# Patient Record
Sex: Male | Born: 1940 | Smoking: Former smoker
Health system: Southern US, Community
[De-identification: ages and names within clinical notes are randomized; demographics above are authoritative.]

## PROBLEM LIST (undated history)

## (undated) DIAGNOSIS — I471 Supraventricular tachycardia: Secondary | ICD-10-CM

## (undated) DIAGNOSIS — N529 Male erectile dysfunction, unspecified: Secondary | ICD-10-CM

## (undated) DIAGNOSIS — Z86010 Personal history of colonic polyps: Principal | ICD-10-CM

## (undated) DIAGNOSIS — K648 Other hemorrhoids: Secondary | ICD-10-CM

## (undated) DIAGNOSIS — K644 Residual hemorrhoidal skin tags: Secondary | ICD-10-CM

## (undated) DIAGNOSIS — J309 Allergic rhinitis, unspecified: Secondary | ICD-10-CM

## (undated) DIAGNOSIS — F419 Anxiety disorder, unspecified: Secondary | ICD-10-CM

## (undated) DIAGNOSIS — E785 Hyperlipidemia, unspecified: Secondary | ICD-10-CM

## (undated) DIAGNOSIS — K5520 Angiodysplasia of colon without hemorrhage: Secondary | ICD-10-CM

## (undated) HISTORY — PX: OTHER SURGICAL HISTORY: SHX169

## (undated) HISTORY — DX: Residual hemorrhoidal skin tags: K64.4

## (undated) HISTORY — DX: Angiodysplasia of colon without hemorrhage: K55.20

## (undated) HISTORY — DX: Other hemorrhoids: K64.8

## (undated) HISTORY — DX: Supraventricular tachycardia: I47.1

## (undated) HISTORY — PX: COLONOSCOPY: SHX174

## (undated) HISTORY — DX: Allergic rhinitis, unspecified: J30.9

## (undated) HISTORY — DX: Hyperlipidemia, unspecified: E78.5

## (undated) HISTORY — DX: Male erectile dysfunction, unspecified: N52.9

## (undated) HISTORY — DX: Personal history of colonic polyps: Z86.010

## (undated) HISTORY — DX: Anxiety disorder, unspecified: F41.9

## (undated) HISTORY — PX: CATARACT EXTRACTION: SUR2

---

## 2007-03-31 ENCOUNTER — Ambulatory Visit: Payer: Self-pay | Admitting: Internal Medicine

## 2007-04-14 ENCOUNTER — Ambulatory Visit: Payer: Self-pay | Admitting: Internal Medicine

## 2014-06-14 DIAGNOSIS — H33313 Horseshoe tear of retina without detachment, bilateral: Secondary | ICD-10-CM | POA: Diagnosis not present

## 2014-06-14 DIAGNOSIS — H35372 Puckering of macula, left eye: Secondary | ICD-10-CM | POA: Diagnosis not present

## 2014-06-14 DIAGNOSIS — Z87891 Personal history of nicotine dependence: Secondary | ICD-10-CM | POA: Diagnosis not present

## 2014-06-14 DIAGNOSIS — I10 Essential (primary) hypertension: Secondary | ICD-10-CM | POA: Diagnosis not present

## 2014-06-14 DIAGNOSIS — N400 Enlarged prostate without lower urinary tract symptoms: Secondary | ICD-10-CM | POA: Diagnosis not present

## 2014-06-14 DIAGNOSIS — F419 Anxiety disorder, unspecified: Secondary | ICD-10-CM | POA: Diagnosis not present

## 2014-06-14 DIAGNOSIS — I2510 Atherosclerotic heart disease of native coronary artery without angina pectoris: Secondary | ICD-10-CM | POA: Diagnosis not present

## 2014-06-14 DIAGNOSIS — Z7982 Long term (current) use of aspirin: Secondary | ICD-10-CM | POA: Diagnosis not present

## 2014-06-14 DIAGNOSIS — Z9889 Other specified postprocedural states: Secondary | ICD-10-CM | POA: Diagnosis not present

## 2014-06-15 DIAGNOSIS — H43813 Vitreous degeneration, bilateral: Secondary | ICD-10-CM | POA: Diagnosis not present

## 2014-06-15 DIAGNOSIS — Z9889 Other specified postprocedural states: Secondary | ICD-10-CM | POA: Diagnosis not present

## 2014-06-15 DIAGNOSIS — Z48810 Encounter for surgical aftercare following surgery on the sense organs: Secondary | ICD-10-CM | POA: Diagnosis not present

## 2014-06-15 DIAGNOSIS — H2513 Age-related nuclear cataract, bilateral: Secondary | ICD-10-CM | POA: Diagnosis not present

## 2014-06-15 DIAGNOSIS — H33313 Horseshoe tear of retina without detachment, bilateral: Secondary | ICD-10-CM | POA: Diagnosis not present

## 2014-08-23 DIAGNOSIS — H40013 Open angle with borderline findings, low risk, bilateral: Secondary | ICD-10-CM | POA: Diagnosis not present

## 2014-09-13 DIAGNOSIS — L821 Other seborrheic keratosis: Secondary | ICD-10-CM | POA: Diagnosis not present

## 2014-09-21 DIAGNOSIS — N401 Enlarged prostate with lower urinary tract symptoms: Secondary | ICD-10-CM | POA: Diagnosis not present

## 2014-09-21 DIAGNOSIS — R351 Nocturia: Secondary | ICD-10-CM | POA: Diagnosis not present

## 2014-09-21 DIAGNOSIS — I10 Essential (primary) hypertension: Secondary | ICD-10-CM | POA: Diagnosis not present

## 2014-09-22 DIAGNOSIS — K640 First degree hemorrhoids: Secondary | ICD-10-CM | POA: Diagnosis not present

## 2014-09-22 DIAGNOSIS — N401 Enlarged prostate with lower urinary tract symptoms: Secondary | ICD-10-CM | POA: Diagnosis not present

## 2014-09-22 DIAGNOSIS — R351 Nocturia: Secondary | ICD-10-CM | POA: Diagnosis not present

## 2014-09-22 DIAGNOSIS — I10 Essential (primary) hypertension: Secondary | ICD-10-CM | POA: Diagnosis not present

## 2014-09-22 DIAGNOSIS — F419 Anxiety disorder, unspecified: Secondary | ICD-10-CM | POA: Diagnosis not present

## 2014-11-24 DIAGNOSIS — B351 Tinea unguium: Secondary | ICD-10-CM | POA: Diagnosis not present

## 2014-11-24 DIAGNOSIS — M79675 Pain in left toe(s): Secondary | ICD-10-CM | POA: Diagnosis not present

## 2014-11-24 DIAGNOSIS — M79674 Pain in right toe(s): Secondary | ICD-10-CM | POA: Diagnosis not present

## 2014-12-02 DIAGNOSIS — H2513 Age-related nuclear cataract, bilateral: Secondary | ICD-10-CM | POA: Diagnosis not present

## 2014-12-02 DIAGNOSIS — H43813 Vitreous degeneration, bilateral: Secondary | ICD-10-CM | POA: Diagnosis not present

## 2014-12-02 DIAGNOSIS — H33313 Horseshoe tear of retina without detachment, bilateral: Secondary | ICD-10-CM | POA: Diagnosis not present

## 2014-12-02 DIAGNOSIS — H35373 Puckering of macula, bilateral: Secondary | ICD-10-CM | POA: Diagnosis not present

## 2014-12-22 DIAGNOSIS — M79675 Pain in left toe(s): Secondary | ICD-10-CM | POA: Diagnosis not present

## 2014-12-22 DIAGNOSIS — Z23 Encounter for immunization: Secondary | ICD-10-CM | POA: Diagnosis not present

## 2014-12-22 DIAGNOSIS — M79674 Pain in right toe(s): Secondary | ICD-10-CM | POA: Diagnosis not present

## 2015-02-02 DIAGNOSIS — H2513 Age-related nuclear cataract, bilateral: Secondary | ICD-10-CM | POA: Diagnosis not present

## 2015-03-14 DIAGNOSIS — M79675 Pain in left toe(s): Secondary | ICD-10-CM | POA: Diagnosis not present

## 2015-03-14 DIAGNOSIS — M79674 Pain in right toe(s): Secondary | ICD-10-CM | POA: Diagnosis not present

## 2015-04-04 DIAGNOSIS — L821 Other seborrheic keratosis: Secondary | ICD-10-CM | POA: Diagnosis not present

## 2015-04-04 DIAGNOSIS — L570 Actinic keratosis: Secondary | ICD-10-CM | POA: Diagnosis not present

## 2015-04-04 DIAGNOSIS — L578 Other skin changes due to chronic exposure to nonionizing radiation: Secondary | ICD-10-CM | POA: Diagnosis not present

## 2015-04-07 DIAGNOSIS — R351 Nocturia: Secondary | ICD-10-CM | POA: Diagnosis not present

## 2015-04-07 DIAGNOSIS — I10 Essential (primary) hypertension: Secondary | ICD-10-CM | POA: Diagnosis not present

## 2015-04-07 DIAGNOSIS — N401 Enlarged prostate with lower urinary tract symptoms: Secondary | ICD-10-CM | POA: Diagnosis not present

## 2015-05-26 DIAGNOSIS — I739 Peripheral vascular disease, unspecified: Secondary | ICD-10-CM | POA: Diagnosis not present

## 2015-05-26 DIAGNOSIS — Z0000 Encounter for general adult medical examination without abnormal findings: Secondary | ICD-10-CM | POA: Diagnosis not present

## 2015-05-26 DIAGNOSIS — Z23 Encounter for immunization: Secondary | ICD-10-CM | POA: Diagnosis not present

## 2015-05-26 DIAGNOSIS — N401 Enlarged prostate with lower urinary tract symptoms: Secondary | ICD-10-CM | POA: Diagnosis not present

## 2015-05-26 DIAGNOSIS — N529 Male erectile dysfunction, unspecified: Secondary | ICD-10-CM | POA: Diagnosis not present

## 2015-05-26 DIAGNOSIS — Z125 Encounter for screening for malignant neoplasm of prostate: Secondary | ICD-10-CM | POA: Diagnosis not present

## 2015-05-26 DIAGNOSIS — I10 Essential (primary) hypertension: Secondary | ICD-10-CM | POA: Diagnosis not present

## 2015-05-26 DIAGNOSIS — I472 Ventricular tachycardia: Secondary | ICD-10-CM | POA: Diagnosis not present

## 2015-05-26 DIAGNOSIS — J301 Allergic rhinitis due to pollen: Secondary | ICD-10-CM | POA: Diagnosis not present

## 2015-05-26 DIAGNOSIS — R351 Nocturia: Secondary | ICD-10-CM | POA: Diagnosis not present

## 2015-06-13 DIAGNOSIS — Z9889 Other specified postprocedural states: Secondary | ICD-10-CM | POA: Diagnosis not present

## 2015-06-13 DIAGNOSIS — I10 Essential (primary) hypertension: Secondary | ICD-10-CM | POA: Diagnosis not present

## 2015-06-13 DIAGNOSIS — I2510 Atherosclerotic heart disease of native coronary artery without angina pectoris: Secondary | ICD-10-CM | POA: Diagnosis not present

## 2015-06-13 DIAGNOSIS — Z7982 Long term (current) use of aspirin: Secondary | ICD-10-CM | POA: Diagnosis not present

## 2015-06-13 DIAGNOSIS — H2513 Age-related nuclear cataract, bilateral: Secondary | ICD-10-CM | POA: Diagnosis not present

## 2015-06-13 DIAGNOSIS — Z87891 Personal history of nicotine dependence: Secondary | ICD-10-CM | POA: Diagnosis not present

## 2015-06-13 DIAGNOSIS — H2512 Age-related nuclear cataract, left eye: Secondary | ICD-10-CM | POA: Diagnosis not present

## 2015-06-16 DIAGNOSIS — Z87891 Personal history of nicotine dependence: Secondary | ICD-10-CM | POA: Diagnosis not present

## 2015-06-16 DIAGNOSIS — J310 Chronic rhinitis: Secondary | ICD-10-CM | POA: Diagnosis not present

## 2015-06-16 DIAGNOSIS — Z7982 Long term (current) use of aspirin: Secondary | ICD-10-CM | POA: Diagnosis not present

## 2015-06-16 DIAGNOSIS — H25812 Combined forms of age-related cataract, left eye: Secondary | ICD-10-CM | POA: Diagnosis not present

## 2015-06-16 DIAGNOSIS — I10 Essential (primary) hypertension: Secondary | ICD-10-CM | POA: Diagnosis not present

## 2015-06-16 DIAGNOSIS — I2510 Atherosclerotic heart disease of native coronary artery without angina pectoris: Secondary | ICD-10-CM | POA: Diagnosis not present

## 2015-06-16 DIAGNOSIS — F419 Anxiety disorder, unspecified: Secondary | ICD-10-CM | POA: Diagnosis not present

## 2015-07-14 DIAGNOSIS — F419 Anxiety disorder, unspecified: Secondary | ICD-10-CM | POA: Diagnosis not present

## 2015-07-14 DIAGNOSIS — J310 Chronic rhinitis: Secondary | ICD-10-CM | POA: Diagnosis not present

## 2015-07-14 DIAGNOSIS — H25811 Combined forms of age-related cataract, right eye: Secondary | ICD-10-CM | POA: Diagnosis not present

## 2015-07-14 DIAGNOSIS — Z87891 Personal history of nicotine dependence: Secondary | ICD-10-CM | POA: Diagnosis not present

## 2015-07-14 DIAGNOSIS — I10 Essential (primary) hypertension: Secondary | ICD-10-CM | POA: Diagnosis not present

## 2015-07-25 DIAGNOSIS — I472 Ventricular tachycardia: Secondary | ICD-10-CM | POA: Diagnosis not present

## 2015-08-30 DIAGNOSIS — H578 Other specified disorders of eye and adnexa: Secondary | ICD-10-CM | POA: Diagnosis not present

## 2015-10-03 DIAGNOSIS — D225 Melanocytic nevi of trunk: Secondary | ICD-10-CM | POA: Diagnosis not present

## 2015-10-03 DIAGNOSIS — L821 Other seborrheic keratosis: Secondary | ICD-10-CM | POA: Diagnosis not present

## 2015-10-03 DIAGNOSIS — L570 Actinic keratosis: Secondary | ICD-10-CM | POA: Diagnosis not present

## 2015-11-18 DIAGNOSIS — H26493 Other secondary cataract, bilateral: Secondary | ICD-10-CM | POA: Diagnosis not present

## 2015-12-23 DIAGNOSIS — H26492 Other secondary cataract, left eye: Secondary | ICD-10-CM | POA: Diagnosis not present

## 2016-01-03 DIAGNOSIS — M25531 Pain in right wrist: Secondary | ICD-10-CM | POA: Diagnosis not present

## 2016-02-14 DIAGNOSIS — M19031 Primary osteoarthritis, right wrist: Secondary | ICD-10-CM | POA: Diagnosis not present

## 2016-03-07 DIAGNOSIS — E785 Hyperlipidemia, unspecified: Secondary | ICD-10-CM | POA: Diagnosis not present

## 2016-03-07 DIAGNOSIS — Z125 Encounter for screening for malignant neoplasm of prostate: Secondary | ICD-10-CM | POA: Diagnosis not present

## 2016-03-07 DIAGNOSIS — F411 Generalized anxiety disorder: Secondary | ICD-10-CM | POA: Diagnosis not present

## 2016-03-07 DIAGNOSIS — Z0000 Encounter for general adult medical examination without abnormal findings: Secondary | ICD-10-CM | POA: Diagnosis not present

## 2016-03-07 DIAGNOSIS — N529 Male erectile dysfunction, unspecified: Secondary | ICD-10-CM | POA: Diagnosis not present

## 2016-03-07 DIAGNOSIS — R030 Elevated blood-pressure reading, without diagnosis of hypertension: Secondary | ICD-10-CM | POA: Diagnosis not present

## 2016-03-07 DIAGNOSIS — Z23 Encounter for immunization: Secondary | ICD-10-CM | POA: Diagnosis not present

## 2016-03-07 DIAGNOSIS — H6123 Impacted cerumen, bilateral: Secondary | ICD-10-CM | POA: Diagnosis not present

## 2016-03-07 DIAGNOSIS — Z1211 Encounter for screening for malignant neoplasm of colon: Secondary | ICD-10-CM | POA: Diagnosis not present

## 2016-03-08 ENCOUNTER — Encounter: Payer: Self-pay | Admitting: Internal Medicine

## 2016-04-16 DIAGNOSIS — D225 Melanocytic nevi of trunk: Secondary | ICD-10-CM | POA: Diagnosis not present

## 2016-04-16 DIAGNOSIS — L728 Other follicular cysts of the skin and subcutaneous tissue: Secondary | ICD-10-CM | POA: Diagnosis not present

## 2016-04-16 DIAGNOSIS — L570 Actinic keratosis: Secondary | ICD-10-CM | POA: Diagnosis not present

## 2016-04-16 DIAGNOSIS — L821 Other seborrheic keratosis: Secondary | ICD-10-CM | POA: Diagnosis not present

## 2016-05-01 DIAGNOSIS — Z96651 Presence of right artificial knee joint: Secondary | ICD-10-CM | POA: Diagnosis not present

## 2016-05-01 DIAGNOSIS — Z471 Aftercare following joint replacement surgery: Secondary | ICD-10-CM | POA: Diagnosis not present

## 2016-05-01 DIAGNOSIS — M19031 Primary osteoarthritis, right wrist: Secondary | ICD-10-CM | POA: Diagnosis not present

## 2016-05-01 DIAGNOSIS — M25561 Pain in right knee: Secondary | ICD-10-CM | POA: Diagnosis not present

## 2016-05-03 DIAGNOSIS — Z471 Aftercare following joint replacement surgery: Secondary | ICD-10-CM | POA: Diagnosis not present

## 2016-05-03 DIAGNOSIS — Z96651 Presence of right artificial knee joint: Secondary | ICD-10-CM | POA: Diagnosis not present

## 2016-05-03 DIAGNOSIS — M25561 Pain in right knee: Secondary | ICD-10-CM | POA: Diagnosis not present

## 2016-05-08 DIAGNOSIS — M25561 Pain in right knee: Secondary | ICD-10-CM | POA: Diagnosis not present

## 2016-05-10 ENCOUNTER — Ambulatory Visit: Payer: Self-pay | Admitting: Internal Medicine

## 2016-05-22 DIAGNOSIS — M25561 Pain in right knee: Secondary | ICD-10-CM | POA: Diagnosis not present

## 2016-05-29 DIAGNOSIS — M25561 Pain in right knee: Secondary | ICD-10-CM | POA: Diagnosis not present

## 2016-06-28 ENCOUNTER — Encounter: Payer: Self-pay | Admitting: Internal Medicine

## 2016-06-28 ENCOUNTER — Encounter (INDEPENDENT_AMBULATORY_CARE_PROVIDER_SITE_OTHER): Payer: Self-pay

## 2016-06-28 ENCOUNTER — Ambulatory Visit (INDEPENDENT_AMBULATORY_CARE_PROVIDER_SITE_OTHER): Payer: Medicare Other | Admitting: Internal Medicine

## 2016-06-28 VITALS — BP 110/70 | HR 60 | Ht 70.67 in | Wt 172.2 lb

## 2016-06-28 DIAGNOSIS — K625 Hemorrhage of anus and rectum: Secondary | ICD-10-CM | POA: Diagnosis not present

## 2016-06-28 NOTE — Progress Notes (Signed)
   Michael Kelley 76 y.o. [birth year] [id number]  Assessment & Plan:   Encounter Diagnosis  Name Primary?  . Rectal bleeding Yes   Last colonoscopy 2008 -ext hemorrhoids Will repeat - sounds hemorrhoidal but almost 10 yrs since 11/08 colonoscopy and in his age group higher risk neoplasia The risks and benefits as well as alternatives of endoscopic procedure(s) have been discussed and reviewed. All questions answered. The patient agrees to proceed.  Depending upon results consider banding of hemorrhoids. Discussed some today.  Cc: Dr. Simonne Maffucci  Subjective:   Chief Complaint: rectal bleeding  HPI  76 yo widowed wm with 2 year hx intermittent rectal bleeding - bright red on toilet paper and worse with hard stools (occasional). In general defecates w/o problems. No prolapse sxs. Exercises a fair amount - sports etc but in general does not lift weights. Has not tried any treatments. No Known Allergies Current Meds  Medication Sig  . ALPRAZolam (XANAX) 0.25 MG tablet 0.25 mg as needed.  Marland Kitchen ascorbic acid (VITAMIN C) 250 MG tablet 250 mg daily.  Marland Kitchen aspirin 81 MG chewable tablet 182 mg 2 (two) times daily.  . fluticasone (FLONASE) 50 MCG/ACT nasal spray as needed.  Marland Kitchen glucosamine-chondroitin 500-400 MG tablet 2 (two) times daily.  Marland Kitchen ibuprofen (ADVIL) 200 MG tablet Take by mouth as needed.  . Multiple Vitamin (MULTI-VITAMINS) TABS daily.  Clarnce Flock Palmetto Extract 80-15 MG CAPS Take by mouth daily.  . tadalafil (CIALIS) 20 MG tablet as directed.    Past Surgical History:  Procedure Laterality Date  . CATARACT EXTRACTION    . COLONOSCOPY    . heart ablation     Social History   Social History  . Marital status: Widowed    Spouse name: N/A  . Number of children: 2  . Years of education: N/A   Occupational History  . retired    Social History Main Topics  . Smoking status: Former Research scientist (life sciences)  . Smokeless tobacco: Never Used  . Alcohol use 1.2 - 1.8 oz/week    2 - 3 Cans of  beer per week     Comment: daily  . Drug use: No  . Sexual activity: Not Asked   Other Topics Concern  . None   Social History Narrative   Retired Diplomatic Services operational officer   Widowed   1 son and 1 daughter   2-3 caffeine/day   06/28/2016      Family History  Problem Relation Age of Onset  . Breast cancer Mother   . Lung cancer Father   . Heart attack Brother 65    died  . Colon cancer Neg Hx   . Stomach cancer Neg Hx   . Rectal cancer Neg Hx   . Esophageal cancer Neg Hx   . Liver cancer Neg Hx     Review of Systems All other ROS negative  Objective:   Physical Exam BP 110/70   Pulse 60   Ht 5' 10.67" (1.795 m)   Wt 172 lb 4 oz (78.1 kg)   BMI 24.25 kg/m  Anicteric Lungs cta cor s1s2 no murmur abd soft NT Rectal is deferred Alert and oriented x 3 and appropriate mood/affect

## 2016-06-28 NOTE — Patient Instructions (Signed)
You have been scheduled for a colonoscopy. Please follow written instructions given to you at your visit today.  Please pick up your prep supplies at the pharmacy. If you use inhalers (even only as needed), please bring them with you on the day of your procedure.   I appreciate the opportunity to care for you. Carl Gessner, MD, FACG 

## 2016-07-03 DIAGNOSIS — M19031 Primary osteoarthritis, right wrist: Secondary | ICD-10-CM | POA: Diagnosis not present

## 2016-08-02 ENCOUNTER — Encounter: Payer: Self-pay | Admitting: Internal Medicine

## 2016-08-02 ENCOUNTER — Ambulatory Visit (AMBULATORY_SURGERY_CENTER): Payer: Medicare Other | Admitting: Internal Medicine

## 2016-08-02 VITALS — BP 122/69 | HR 53 | Temp 98.0°F | Resp 14 | Ht 70.0 in | Wt 172.0 lb

## 2016-08-02 DIAGNOSIS — K625 Hemorrhage of anus and rectum: Secondary | ICD-10-CM

## 2016-08-02 DIAGNOSIS — D124 Benign neoplasm of descending colon: Secondary | ICD-10-CM

## 2016-08-02 DIAGNOSIS — Z1211 Encounter for screening for malignant neoplasm of colon: Secondary | ICD-10-CM | POA: Diagnosis not present

## 2016-08-02 DIAGNOSIS — K5520 Angiodysplasia of colon without hemorrhage: Secondary | ICD-10-CM

## 2016-08-02 HISTORY — DX: Angiodysplasia of colon without hemorrhage: K55.20

## 2016-08-02 MED ORDER — SODIUM CHLORIDE 0.9 % IV SOLN: 500.0000 mL | INTRAVENOUS | Status: AC

## 2016-08-02 NOTE — Patient Instructions (Addendum)
I found and removed one tiny polyp - looks benign. You have hemorrhoids and thickened skin tags in the anal area - cause of bleeding.  There was also a tiny lesion called an AVM - sometimes leaks blood or bleeds but yours is small and appears inconsequential.  High fiber diet is recommended.  I do not think you need another routine colonoscopy.  I appreciate the opportunity to care for you. Gatha Mayer, MD, [facility]   Discharge instructions given. Handouts on polyps and hemorrhoids. Resume previous medications. YOU HAD AN ENDOSCOPIC PROCEDURE TODAY AT [facility]:   Refer to the procedure report that was given to you for any specific questions about what was found during the examination.  If the procedure report does not answer your questions, please call your gastroenterologist to clarify.  If you requested that your care partner not be given the details of your procedure findings, then the procedure report has been included in a sealed envelope for you to review at your convenience later.  YOU SHOULD EXPECT: Some feelings of bloating in the abdomen. Passage of more gas than usual.  Walking can help get rid of the air that was put into your GI tract during the procedure and reduce the bloating. If you had a lower endoscopy (such as a colonoscopy or flexible sigmoidoscopy) you may notice spotting of blood in your stool or on the toilet paper. If you underwent a bowel prep for your procedure, you may not have a normal bowel movement for a few days.  Please Note:  You might notice some irritation and congestion in your nose or some drainage.  This is from the oxygen used during your procedure.  There is no need for concern and it should clear up in a day or so.  SYMPTOMS TO REPORT IMMEDIATELY:   Following lower endoscopy (colonoscopy or flexible sigmoidoscopy):  Excessive amounts of blood in the stool  Significant tenderness or worsening of abdominal  pains  Swelling of the abdomen that is new, acute  Fever of 100F or higher   For urgent or emergent issues, a gastroenterologist can be reached at any hour by calling [phone].   DIET:  We do recommend a small meal at first, but then you may proceed to your regular diet.  Drink plenty of fluids but you should avoid alcoholic beverages for 24 hours.  ACTIVITY:  You should plan to take it easy for the rest of today and you should NOT DRIVE or use heavy machinery until tomorrow (because of the sedation medicines used during the test).    FOLLOW UP: Our staff will call the number listed on your records the next business day following your procedure to check on you and address any questions or concerns that you may have regarding the information given to you following your procedure. If we do not reach you, we will leave a message.  However, if you are feeling well and you are not experiencing any problems, there is no need to return our call.  We will assume that you have returned to your regular daily activities without incident.  If any biopsies were taken you will be contacted by phone or by letter within the next 1-3 weeks.  Please call us at [phone] if you have not heard about the biopsies in 3 weeks.    SIGNATURES/CONFIDENTIALITY: You and/or your care partner have signed paperwork which will be entered into your electronic medical record.  These signatures  attest to the fact that that the information above on your After Visit Summary has been reviewed and is understood.  Full responsibility of the confidentiality of this discharge information lies with you and/or your care-partner.

## 2016-08-02 NOTE — Progress Notes (Signed)
A and O x3. Report to RN. Tolerated MAC anesthesia well.

## 2016-08-02 NOTE — Progress Notes (Signed)
Called to room to assist during endoscopic procedure.  Patient ID and intended procedure confirmed with present staff. Received instructions for my participation in the procedure from the performing physician.  

## 2016-08-02 NOTE — Op Note (Signed)
[place] Patient Name: Michael Kelley Procedure Date: 08/02/2016 2:59 PM MRN: [id number] Endoscopist: Gatha Mayer , MD Age: 76 Referring MD:  Date of Birth: 1940-09-15 Gender: Male Account #: [id number] Procedure:                Colonoscopy Indications:              Rectal bleeding Medicines:                Propofol per Anesthesia, Monitored Anesthesia Care Procedure:                Pre-Anesthesia Assessment:                           - Prior to the procedure, a History and Physical                            was performed, and patient medications and                            allergies were reviewed. The patient's tolerance of                            previous anesthesia was also reviewed. The risks                            and benefits of the procedure and the sedation                            options and risks were discussed with the patient.                            All questions were answered, and informed consent                            was obtained. Prior Anticoagulants: The patient                            last took aspirin 1 day prior to the procedure. ASA                            Grade Assessment: II - A patient with mild systemic                            disease. After reviewing the risks and benefits,                            the patient was deemed in satisfactory condition to                            undergo the procedure.                           After obtaining informed consent, the colonoscope  was passed under direct vision. Throughout the                            procedure, the patient's blood pressure, pulse, and                            oxygen saturations were monitored continuously. The                            Colonoscope was introduced through the anus and                            advanced to the the cecum, identified by                            appendiceal orifice and ileocecal valve. The                             colonoscopy was performed without difficulty. The                            patient tolerated the procedure well. The quality                            of the bowel preparation was excellent. The                            ileocecal valve, appendiceal orifice, and rectum                            were photographed. The bowel preparation used was                            Miralax. Scope In: 3:04:19 PM Scope Out: 3:20:50 PM Scope Withdrawal Time: 0 hours 12 minutes 32 seconds  Total Procedure Duration: 0 hours 16 minutes 31 seconds  Findings:                 The perianal exam findings include skin tags.                           The digital rectal exam was normal. Pertinent                            negatives include normal prostate (size, shape, and                            consistency).                           A diminutive polyp was found in the descending                            colon. The polyp was sessile. The polyp was removed  with a cold snare. Resection and retrieval were                            complete. Verification of patient identification                            for the specimen was done. Estimated blood loss was                            minimal.                           External and internal hemorrhoids were found during                            retroflexion.                           A single small angiodysplastic lesion without                            bleeding was found in the cecum.                           The exam was otherwise without abnormality on                            direct and retroflexion views. Complications:            No immediate complications. Estimated Blood Loss:     Estimated blood loss was minimal. Impression:               - Perianal skin tags found on perianal exam.                           - One diminutive polyp in the descending colon,                             removed with a cold snare. Resected and retrieved.                           - External and internal hemorrhoids.                           - A single non-bleeding colonic angiodysplastic                            lesion.                           - The examination was otherwise normal on direct                            and retroflexion views. Recommendation:           - Patient has a contact number available for  emergencies. The signs and symptoms of potential                            delayed complications were discussed with the                            patient. Return to normal activities tomorrow.                            Written discharge instructions were provided to the                            patient.                           - High fiber diet.                           - Continue present medications.                           - Await pathology results.                           - No repeat colonoscopy due to age. Gatha Mayer, MD 08/02/2016 3:30:01 PM This report has been signed electronically.

## 2016-08-03 ENCOUNTER — Telehealth: Payer: Self-pay

## 2016-08-03 NOTE — Telephone Encounter (Signed)
  Follow up Call-  Call back number 08/02/2016  Post procedure Call Back phone  # [phone]  Permission to leave phone message Yes  Some recent data might be hidden     Patient questions:  Do you have a fever, pain , or abdominal swelling? No. Pain Score  0 *  Have you tolerated food without any problems? Yes.    Have you been able to return to your normal activities? Yes.    Do you have any questions about your discharge instructions: Diet   No. Medications  No. Follow up visit  No.  Do you have questions or concerns about your Care? No.  Actions: * If pain score is 4 or above: No action needed, pain <4.

## 2016-08-09 DIAGNOSIS — H35373 Puckering of macula, bilateral: Secondary | ICD-10-CM | POA: Diagnosis not present

## 2016-08-09 DIAGNOSIS — Z961 Presence of intraocular lens: Secondary | ICD-10-CM | POA: Diagnosis not present

## 2016-08-09 DIAGNOSIS — Z8669 Personal history of other diseases of the nervous system and sense organs: Secondary | ICD-10-CM | POA: Diagnosis not present

## 2016-08-13 ENCOUNTER — Encounter: Payer: Self-pay | Admitting: Internal Medicine

## 2016-08-13 DIAGNOSIS — Z860101 Personal history of adenomatous and serrated colon polyps: Secondary | ICD-10-CM | POA: Insufficient documentation

## 2016-08-13 DIAGNOSIS — Z86010 Personal history of colonic polyps: Secondary | ICD-10-CM

## 2016-08-13 HISTORY — DX: Personal history of colonic polyps: Z86.010

## 2016-08-13 NOTE — Progress Notes (Signed)
Diminutive adenoma No recall - age

## 2016-10-05 DIAGNOSIS — D225 Melanocytic nevi of trunk: Secondary | ICD-10-CM | POA: Diagnosis not present

## 2016-10-05 DIAGNOSIS — L821 Other seborrheic keratosis: Secondary | ICD-10-CM | POA: Diagnosis not present

## 2016-10-05 DIAGNOSIS — L570 Actinic keratosis: Secondary | ICD-10-CM | POA: Diagnosis not present

## 2016-10-08 DIAGNOSIS — I10 Essential (primary) hypertension: Secondary | ICD-10-CM | POA: Diagnosis not present

## 2016-10-08 DIAGNOSIS — E785 Hyperlipidemia, unspecified: Secondary | ICD-10-CM | POA: Diagnosis not present

## 2016-10-08 DIAGNOSIS — J069 Acute upper respiratory infection, unspecified: Secondary | ICD-10-CM | POA: Diagnosis not present

## 2016-10-08 DIAGNOSIS — F411 Generalized anxiety disorder: Secondary | ICD-10-CM | POA: Diagnosis not present

## 2016-10-22 DIAGNOSIS — I10 Essential (primary) hypertension: Secondary | ICD-10-CM | POA: Diagnosis not present

## 2016-11-19 ENCOUNTER — Other Ambulatory Visit: Payer: Self-pay | Admitting: Family Medicine

## 2016-11-19 ENCOUNTER — Ambulatory Visit
Admission: RE | Admit: 2016-11-19 | Discharge: 2016-11-19 | Disposition: A | Discharge status: Home / Self Care | Payer: Medicare Other | Source: Ambulatory Visit | Attending: Family Medicine | Admitting: Family Medicine

## 2016-11-19 DIAGNOSIS — R059 Cough, unspecified: Secondary | ICD-10-CM

## 2016-11-19 DIAGNOSIS — R05 Cough: Secondary | ICD-10-CM

## 2016-11-19 DIAGNOSIS — J309 Allergic rhinitis, unspecified: Secondary | ICD-10-CM | POA: Diagnosis not present

## 2017-03-25 DIAGNOSIS — Z125 Encounter for screening for malignant neoplasm of prostate: Secondary | ICD-10-CM | POA: Diagnosis not present

## 2017-03-25 DIAGNOSIS — I10 Essential (primary) hypertension: Secondary | ICD-10-CM | POA: Diagnosis not present

## 2017-03-25 DIAGNOSIS — E785 Hyperlipidemia, unspecified: Secondary | ICD-10-CM | POA: Diagnosis not present

## 2017-03-25 DIAGNOSIS — R0789 Other chest pain: Secondary | ICD-10-CM | POA: Diagnosis not present

## 2017-03-25 DIAGNOSIS — Z0000 Encounter for general adult medical examination without abnormal findings: Secondary | ICD-10-CM | POA: Diagnosis not present

## 2017-03-25 DIAGNOSIS — Z23 Encounter for immunization: Secondary | ICD-10-CM | POA: Diagnosis not present

## 2017-03-28 ENCOUNTER — Telehealth: Payer: Self-pay | Admitting: Cardiology

## 2017-03-28 NOTE — Telephone Encounter (Signed)
Received records from [place] for appointment on 04/22/17 with Dr Ellyn Hack.  Records put with Dr Allison Quarry schedule for 04/22/17. lp

## 2017-04-08 DIAGNOSIS — Z09 Encounter for follow-up examination after completed treatment for conditions other than malignant neoplasm: Secondary | ICD-10-CM | POA: Diagnosis not present

## 2017-04-08 DIAGNOSIS — Z872 Personal history of diseases of the skin and subcutaneous tissue: Secondary | ICD-10-CM | POA: Diagnosis not present

## 2017-04-08 DIAGNOSIS — D225 Melanocytic nevi of trunk: Secondary | ICD-10-CM | POA: Diagnosis not present

## 2017-04-08 DIAGNOSIS — L821 Other seborrheic keratosis: Secondary | ICD-10-CM | POA: Diagnosis not present

## 2017-04-22 ENCOUNTER — Ambulatory Visit: Payer: Medicare Other | Admitting: Cardiology

## 2017-05-06 DIAGNOSIS — R972 Elevated prostate specific antigen [PSA]: Secondary | ICD-10-CM | POA: Diagnosis not present

## 2017-05-06 DIAGNOSIS — N401 Enlarged prostate with lower urinary tract symptoms: Secondary | ICD-10-CM | POA: Diagnosis not present

## 2017-05-06 DIAGNOSIS — N138 Other obstructive and reflux uropathy: Secondary | ICD-10-CM | POA: Diagnosis not present

## 2017-05-23 DIAGNOSIS — H33313 Horseshoe tear of retina without detachment, bilateral: Secondary | ICD-10-CM | POA: Diagnosis not present

## 2017-05-23 DIAGNOSIS — Z961 Presence of intraocular lens: Secondary | ICD-10-CM | POA: Diagnosis not present

## 2017-05-23 DIAGNOSIS — H35373 Puckering of macula, bilateral: Secondary | ICD-10-CM | POA: Diagnosis not present

## 2017-06-28 DIAGNOSIS — K6389 Other specified diseases of intestine: Secondary | ICD-10-CM | POA: Diagnosis not present

## 2017-06-28 DIAGNOSIS — R972 Elevated prostate specific antigen [PSA]: Secondary | ICD-10-CM | POA: Diagnosis not present

## 2017-06-28 DIAGNOSIS — D291 Benign neoplasm of prostate: Secondary | ICD-10-CM | POA: Diagnosis not present

## 2017-07-02 DIAGNOSIS — L720 Epidermal cyst: Secondary | ICD-10-CM | POA: Diagnosis not present

## 2017-07-29 DIAGNOSIS — N401 Enlarged prostate with lower urinary tract symptoms: Secondary | ICD-10-CM | POA: Diagnosis not present

## 2017-07-29 DIAGNOSIS — N138 Other obstructive and reflux uropathy: Secondary | ICD-10-CM | POA: Diagnosis not present

## 2017-07-29 DIAGNOSIS — R972 Elevated prostate specific antigen [PSA]: Secondary | ICD-10-CM | POA: Diagnosis not present

## 2017-09-05 DIAGNOSIS — H918X3 Other specified hearing loss, bilateral: Secondary | ICD-10-CM | POA: Diagnosis not present

## 2017-09-05 DIAGNOSIS — R972 Elevated prostate specific antigen [PSA]: Secondary | ICD-10-CM | POA: Diagnosis not present

## 2017-09-05 DIAGNOSIS — I2510 Atherosclerotic heart disease of native coronary artery without angina pectoris: Secondary | ICD-10-CM | POA: Diagnosis not present

## 2017-09-05 DIAGNOSIS — N138 Other obstructive and reflux uropathy: Secondary | ICD-10-CM | POA: Diagnosis not present

## 2017-09-05 DIAGNOSIS — Z86010 Personal history of colonic polyps: Secondary | ICD-10-CM | POA: Diagnosis not present

## 2017-09-05 DIAGNOSIS — Z789 Other specified health status: Secondary | ICD-10-CM | POA: Diagnosis not present

## 2017-09-05 DIAGNOSIS — I10 Essential (primary) hypertension: Secondary | ICD-10-CM | POA: Diagnosis not present

## 2017-09-05 DIAGNOSIS — N401 Enlarged prostate with lower urinary tract symptoms: Secondary | ICD-10-CM | POA: Diagnosis not present

## 2017-09-10 DIAGNOSIS — I472 Ventricular tachycardia: Secondary | ICD-10-CM | POA: Diagnosis not present

## 2017-09-10 DIAGNOSIS — I10 Essential (primary) hypertension: Secondary | ICD-10-CM | POA: Diagnosis not present

## 2017-10-05 DIAGNOSIS — G8918 Other acute postprocedural pain: Secondary | ICD-10-CM | POA: Diagnosis not present

## 2017-10-05 DIAGNOSIS — S32009A Unspecified fracture of unspecified lumbar vertebra, initial encounter for closed fracture: Secondary | ICD-10-CM | POA: Diagnosis not present

## 2017-10-05 DIAGNOSIS — R739 Hyperglycemia, unspecified: Secondary | ICD-10-CM | POA: Diagnosis not present

## 2017-10-05 DIAGNOSIS — M542 Cervicalgia: Secondary | ICD-10-CM | POA: Diagnosis not present

## 2017-10-05 DIAGNOSIS — R61 Generalized hyperhidrosis: Secondary | ICD-10-CM | POA: Diagnosis not present

## 2017-10-05 DIAGNOSIS — S36039A Unspecified laceration of spleen, initial encounter: Secondary | ICD-10-CM | POA: Diagnosis not present

## 2017-10-05 DIAGNOSIS — R0902 Hypoxemia: Secondary | ICD-10-CM | POA: Diagnosis not present

## 2017-10-05 DIAGNOSIS — D62 Acute posthemorrhagic anemia: Secondary | ICD-10-CM | POA: Diagnosis not present

## 2017-10-05 DIAGNOSIS — N400 Enlarged prostate without lower urinary tract symptoms: Secondary | ICD-10-CM | POA: Diagnosis not present

## 2017-10-05 DIAGNOSIS — D735 Infarction of spleen: Secondary | ICD-10-CM | POA: Diagnosis not present

## 2017-10-05 DIAGNOSIS — S22018A Other fracture of first thoracic vertebra, initial encounter for closed fracture: Secondary | ICD-10-CM | POA: Diagnosis not present

## 2017-10-05 DIAGNOSIS — N179 Acute kidney failure, unspecified: Secondary | ICD-10-CM | POA: Diagnosis present

## 2017-10-05 DIAGNOSIS — S3600XA Unspecified injury of spleen, initial encounter: Secondary | ICD-10-CM | POA: Diagnosis not present

## 2017-10-05 DIAGNOSIS — R0602 Shortness of breath: Secondary | ICD-10-CM | POA: Diagnosis not present

## 2017-10-05 DIAGNOSIS — S36032A Major laceration of spleen, initial encounter: Secondary | ICD-10-CM | POA: Diagnosis not present

## 2017-10-05 DIAGNOSIS — K6389 Other specified diseases of intestine: Secondary | ICD-10-CM | POA: Diagnosis not present

## 2017-10-05 DIAGNOSIS — F17210 Nicotine dependence, cigarettes, uncomplicated: Secondary | ICD-10-CM | POA: Diagnosis not present

## 2017-10-05 DIAGNOSIS — Z978 Presence of other specified devices: Secondary | ICD-10-CM | POA: Diagnosis not present

## 2017-10-05 DIAGNOSIS — R079 Chest pain, unspecified: Secondary | ICD-10-CM | POA: Diagnosis not present

## 2017-10-05 DIAGNOSIS — N529 Male erectile dysfunction, unspecified: Secondary | ICD-10-CM | POA: Diagnosis present

## 2017-10-05 DIAGNOSIS — Z938 Other artificial opening status: Secondary | ICD-10-CM | POA: Diagnosis not present

## 2017-10-05 DIAGNOSIS — Y92410 Unspecified street and highway as the place of occurrence of the external cause: Secondary | ICD-10-CM | POA: Diagnosis not present

## 2017-10-05 DIAGNOSIS — S272XXA Traumatic hemopneumothorax, initial encounter: Secondary | ICD-10-CM | POA: Diagnosis not present

## 2017-10-05 DIAGNOSIS — S2249XA Multiple fractures of ribs, unspecified side, initial encounter for closed fracture: Secondary | ICD-10-CM | POA: Diagnosis not present

## 2017-10-05 DIAGNOSIS — J984 Other disorders of lung: Secondary | ICD-10-CM | POA: Diagnosis present

## 2017-10-05 DIAGNOSIS — R0781 Pleurodynia: Secondary | ICD-10-CM | POA: Diagnosis not present

## 2017-10-05 DIAGNOSIS — R110 Nausea: Secondary | ICD-10-CM | POA: Diagnosis not present

## 2017-10-05 DIAGNOSIS — S32018A Other fracture of first lumbar vertebra, initial encounter for closed fracture: Secondary | ICD-10-CM | POA: Diagnosis not present

## 2017-10-05 DIAGNOSIS — Z9889 Other specified postprocedural states: Secondary | ICD-10-CM | POA: Diagnosis not present

## 2017-10-05 DIAGNOSIS — R0689 Other abnormalities of breathing: Secondary | ICD-10-CM | POA: Diagnosis not present

## 2017-10-05 DIAGNOSIS — I10 Essential (primary) hypertension: Secondary | ICD-10-CM | POA: Diagnosis present

## 2017-10-05 DIAGNOSIS — R9431 Abnormal electrocardiogram [ECG] [EKG]: Secondary | ICD-10-CM | POA: Diagnosis not present

## 2017-10-05 DIAGNOSIS — S270XXA Traumatic pneumothorax, initial encounter: Secondary | ICD-10-CM | POA: Diagnosis not present

## 2017-10-05 DIAGNOSIS — I880 Nonspecific mesenteric lymphadenitis: Secondary | ICD-10-CM | POA: Diagnosis not present

## 2017-10-05 DIAGNOSIS — R140 Abdominal distension (gaseous): Secondary | ICD-10-CM | POA: Diagnosis not present

## 2017-10-05 DIAGNOSIS — S3609XA Other injury of spleen, initial encounter: Secondary | ICD-10-CM | POA: Diagnosis not present

## 2017-10-05 DIAGNOSIS — E875 Hyperkalemia: Secondary | ICD-10-CM | POA: Diagnosis not present

## 2017-10-05 DIAGNOSIS — S32019A Unspecified fracture of first lumbar vertebra, initial encounter for closed fracture: Secondary | ICD-10-CM | POA: Diagnosis present

## 2017-10-05 DIAGNOSIS — J939 Pneumothorax, unspecified: Secondary | ICD-10-CM | POA: Diagnosis not present

## 2017-10-05 DIAGNOSIS — I739 Peripheral vascular disease, unspecified: Secondary | ICD-10-CM | POA: Diagnosis present

## 2017-10-05 DIAGNOSIS — I472 Ventricular tachycardia: Secondary | ICD-10-CM | POA: Diagnosis present

## 2017-10-05 DIAGNOSIS — Z041 Encounter for examination and observation following transport accident: Secondary | ICD-10-CM | POA: Diagnosis not present

## 2017-10-05 DIAGNOSIS — R918 Other nonspecific abnormal finding of lung field: Secondary | ICD-10-CM | POA: Diagnosis not present

## 2017-10-05 DIAGNOSIS — Z4659 Encounter for fitting and adjustment of other gastrointestinal appliance and device: Secondary | ICD-10-CM | POA: Diagnosis not present

## 2017-10-05 DIAGNOSIS — Z7982 Long term (current) use of aspirin: Secondary | ICD-10-CM | POA: Diagnosis not present

## 2017-10-05 DIAGNOSIS — I4589 Other specified conduction disorders: Secondary | ICD-10-CM | POA: Diagnosis not present

## 2017-10-05 DIAGNOSIS — E876 Hypokalemia: Secondary | ICD-10-CM | POA: Diagnosis not present

## 2017-10-05 DIAGNOSIS — K567 Ileus, unspecified: Secondary | ICD-10-CM | POA: Diagnosis not present

## 2017-10-05 DIAGNOSIS — S36031A Moderate laceration of spleen, initial encounter: Secondary | ICD-10-CM | POA: Diagnosis not present

## 2017-10-05 DIAGNOSIS — T1490XA Injury, unspecified, initial encounter: Secondary | ICD-10-CM | POA: Diagnosis not present

## 2017-10-05 DIAGNOSIS — Y998 Other external cause status: Secondary | ICD-10-CM | POA: Diagnosis not present

## 2017-10-05 DIAGNOSIS — K219 Gastro-esophageal reflux disease without esophagitis: Secondary | ICD-10-CM | POA: Diagnosis not present

## 2017-10-05 DIAGNOSIS — S2239XA Fracture of one rib, unspecified side, initial encounter for closed fracture: Secondary | ICD-10-CM | POA: Diagnosis present

## 2017-10-05 DIAGNOSIS — S37812A Contusion of adrenal gland, initial encounter: Secondary | ICD-10-CM | POA: Diagnosis not present

## 2017-10-05 DIAGNOSIS — R000 Tachycardia, unspecified: Secondary | ICD-10-CM | POA: Diagnosis not present

## 2017-10-05 DIAGNOSIS — E639 Nutritional deficiency, unspecified: Secondary | ICD-10-CM | POA: Diagnosis not present

## 2017-10-05 DIAGNOSIS — Z7289 Other problems related to lifestyle: Secondary | ICD-10-CM | POA: Diagnosis not present

## 2017-10-05 DIAGNOSIS — G8911 Acute pain due to trauma: Secondary | ICD-10-CM | POA: Diagnosis not present

## 2017-10-05 DIAGNOSIS — S27812A Contusion of esophagus (thoracic part), initial encounter: Secondary | ICD-10-CM | POA: Diagnosis not present

## 2017-10-05 DIAGNOSIS — E878 Other disorders of electrolyte and fluid balance, not elsewhere classified: Secondary | ICD-10-CM | POA: Diagnosis not present

## 2017-10-05 DIAGNOSIS — I2119 ST elevation (STEMI) myocardial infarction involving other coronary artery of inferior wall: Secondary | ICD-10-CM | POA: Diagnosis not present

## 2017-10-05 DIAGNOSIS — R0789 Other chest pain: Secondary | ICD-10-CM | POA: Diagnosis not present

## 2017-10-05 DIAGNOSIS — F419 Anxiety disorder, unspecified: Secondary | ICD-10-CM | POA: Diagnosis present

## 2017-10-05 DIAGNOSIS — I2510 Atherosclerotic heart disease of native coronary artery without angina pectoris: Secondary | ICD-10-CM | POA: Diagnosis present

## 2017-10-05 DIAGNOSIS — R109 Unspecified abdominal pain: Secondary | ICD-10-CM | POA: Diagnosis not present

## 2017-10-05 DIAGNOSIS — S2609XA Other injury of heart with hemopericardium, initial encounter: Secondary | ICD-10-CM | POA: Diagnosis not present

## 2017-10-05 DIAGNOSIS — Z86010 Personal history of colonic polyps: Secondary | ICD-10-CM | POA: Diagnosis not present

## 2017-10-05 DIAGNOSIS — E785 Hyperlipidemia, unspecified: Secondary | ICD-10-CM | POA: Diagnosis not present

## 2017-10-05 DIAGNOSIS — S2243XA Multiple fractures of ribs, bilateral, initial encounter for closed fracture: Secondary | ICD-10-CM | POA: Diagnosis not present

## 2017-10-05 DIAGNOSIS — Z9081 Acquired absence of spleen: Secondary | ICD-10-CM | POA: Diagnosis not present

## 2017-10-05 DIAGNOSIS — J948 Other specified pleural conditions: Secondary | ICD-10-CM | POA: Diagnosis not present

## 2017-10-05 DIAGNOSIS — I517 Cardiomegaly: Secondary | ICD-10-CM | POA: Diagnosis not present

## 2017-10-05 DIAGNOSIS — I4892 Unspecified atrial flutter: Secondary | ICD-10-CM | POA: Diagnosis not present

## 2017-10-09 DIAGNOSIS — I4892 Unspecified atrial flutter: Secondary | ICD-10-CM | POA: Diagnosis not present

## 2017-10-09 DIAGNOSIS — I4589 Other specified conduction disorders: Secondary | ICD-10-CM | POA: Diagnosis not present

## 2017-10-09 DIAGNOSIS — R9431 Abnormal electrocardiogram [ECG] [EKG]: Secondary | ICD-10-CM | POA: Diagnosis not present

## 2017-10-18 IMAGING — CR DG CHEST 2V
2 series · 2 of 2 positions shown · non-contrast
Comparison: None.

CLINICAL DATA: 75-year-old with 6 week history of persistent cough.
Former smoker. Personal history of SVT treated with cardiac ablation
in 8479.

EXAM:
CHEST  2 VIEW

[w chest pa]
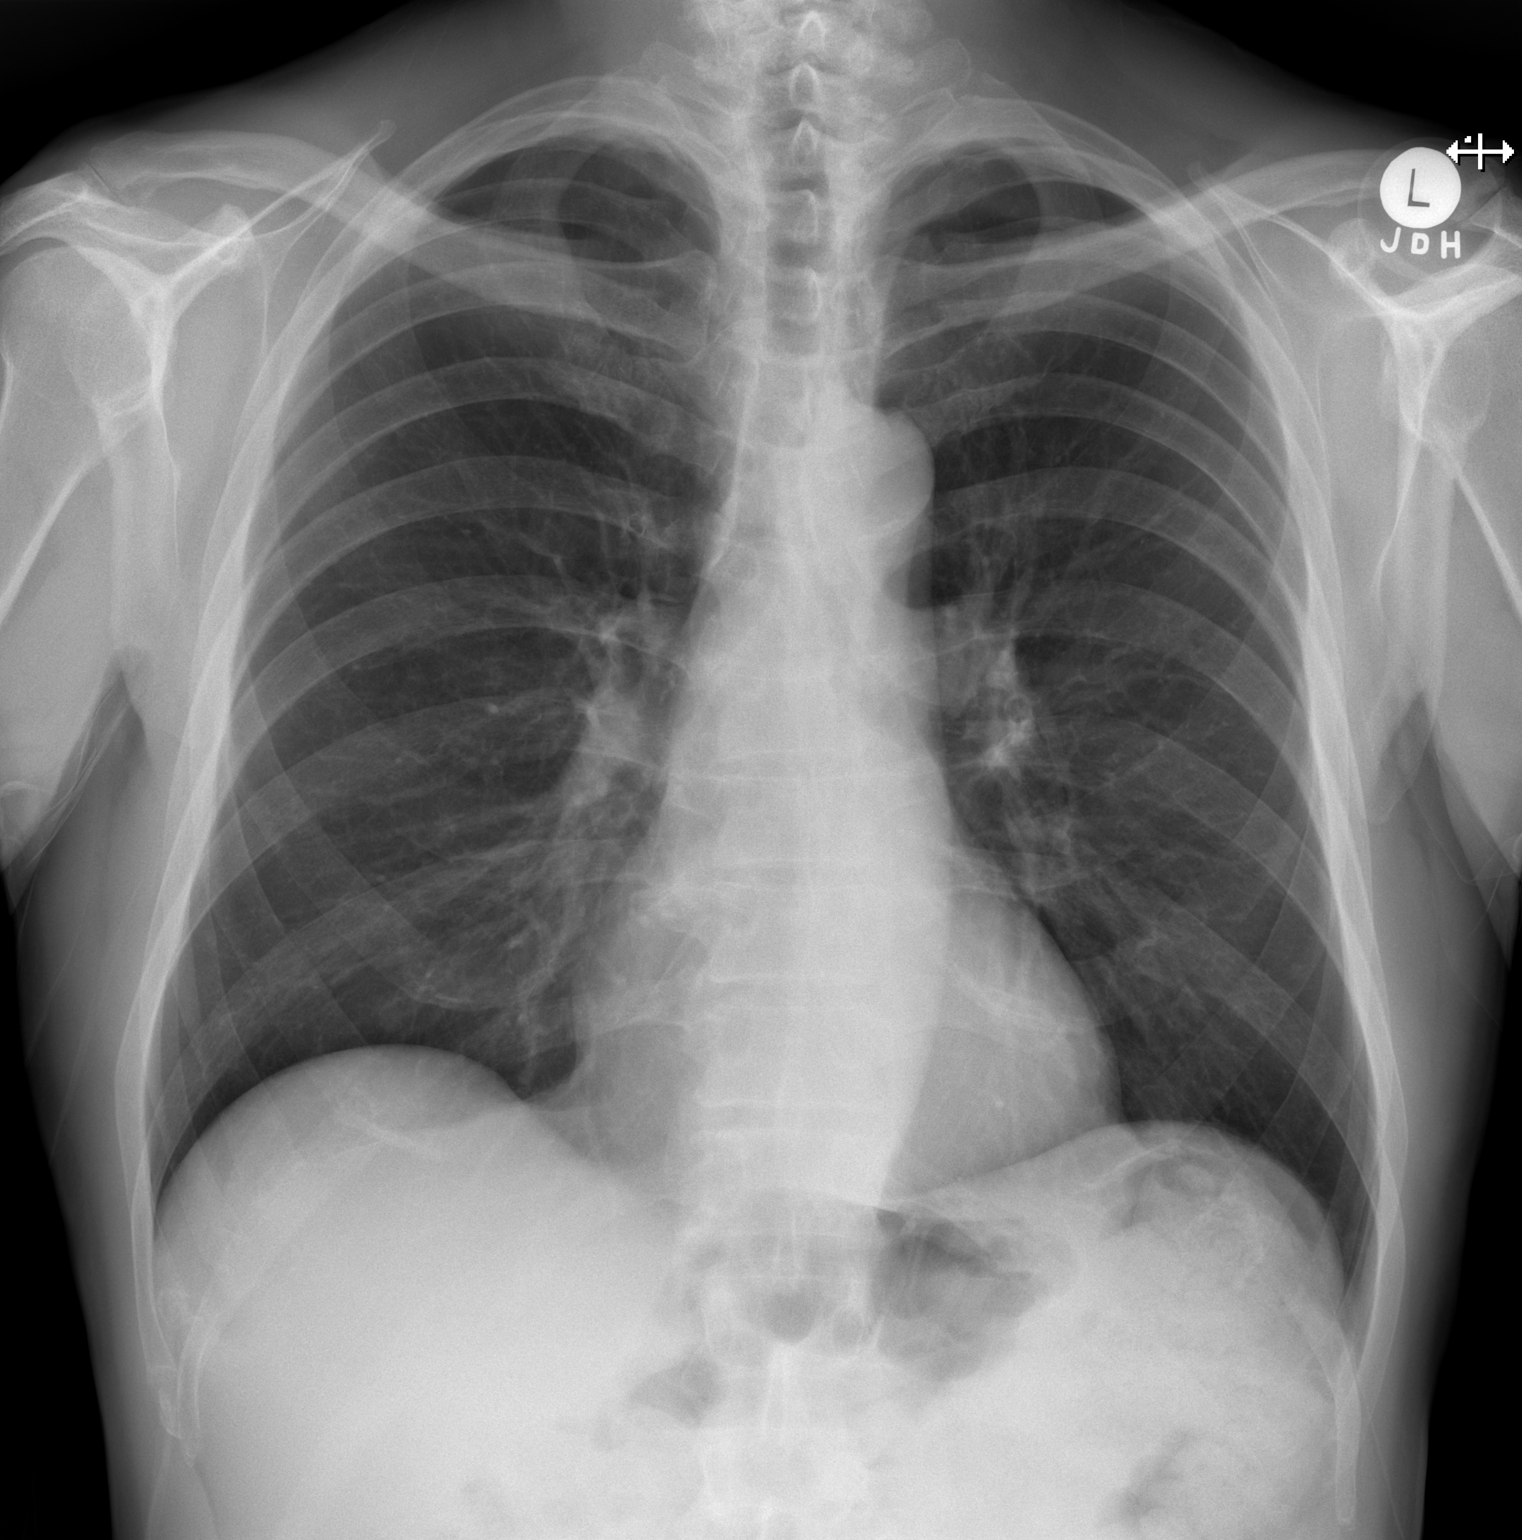

[w chest lat]
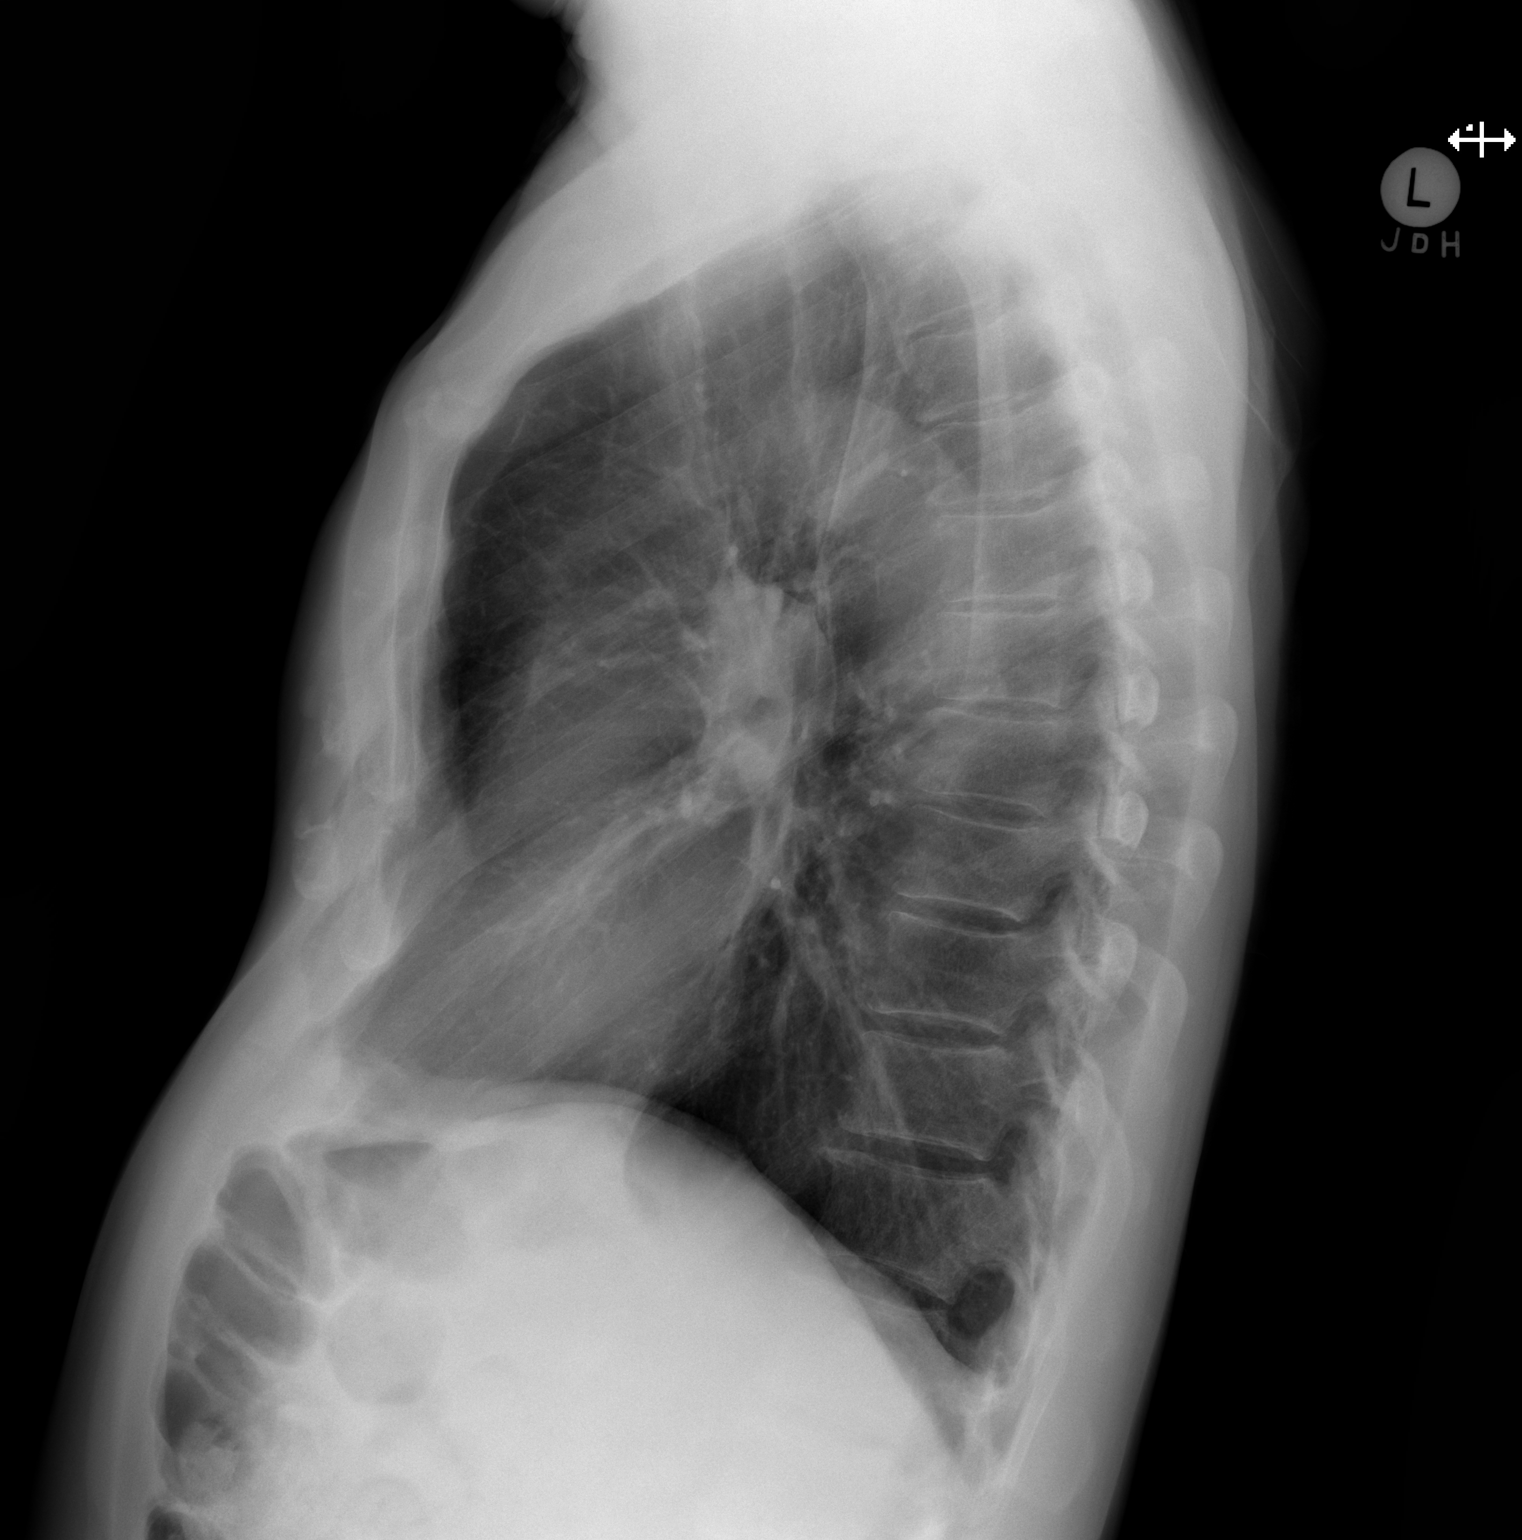

[2 of 2 positions shown; findings below may reference images not displayed]

FINDINGS: Cardiac silhouette normal in size. Thoracic aorta minimally
atherosclerotic. Hilar and mediastinal contours otherwise
unremarkable. Lungs clear. Bronchovascular markings normal.
Pulmonary vascularity normal. No visible pleural effusions. No
pneumothorax. Visualized bony thorax intact apart from mild pectus
excavatum sternal deformity.
IMPRESSION: No acute cardiopulmonary disease.

## 2017-10-29 DIAGNOSIS — Z87891 Personal history of nicotine dependence: Secondary | ICD-10-CM | POA: Diagnosis not present

## 2017-10-29 DIAGNOSIS — R911 Solitary pulmonary nodule: Secondary | ICD-10-CM | POA: Diagnosis not present

## 2017-10-29 DIAGNOSIS — T1490XA Injury, unspecified, initial encounter: Secondary | ICD-10-CM | POA: Diagnosis not present

## 2017-10-29 DIAGNOSIS — J939 Pneumothorax, unspecified: Secondary | ICD-10-CM | POA: Diagnosis not present

## 2017-10-29 DIAGNOSIS — K5909 Other constipation: Secondary | ICD-10-CM | POA: Diagnosis not present

## 2017-10-29 DIAGNOSIS — R9431 Abnormal electrocardiogram [ECG] [EKG]: Secondary | ICD-10-CM | POA: Diagnosis not present

## 2017-10-29 DIAGNOSIS — Z9081 Acquired absence of spleen: Secondary | ICD-10-CM | POA: Diagnosis not present

## 2017-10-29 DIAGNOSIS — Z4682 Encounter for fitting and adjustment of non-vascular catheter: Secondary | ICD-10-CM | POA: Diagnosis not present

## 2017-10-29 DIAGNOSIS — S2243XA Multiple fractures of ribs, bilateral, initial encounter for closed fracture: Secondary | ICD-10-CM | POA: Diagnosis not present

## 2017-10-29 DIAGNOSIS — Z79899 Other long term (current) drug therapy: Secondary | ICD-10-CM | POA: Diagnosis not present

## 2017-10-29 DIAGNOSIS — I480 Paroxysmal atrial fibrillation: Secondary | ICD-10-CM | POA: Diagnosis not present

## 2017-10-29 DIAGNOSIS — Z4889 Encounter for other specified surgical aftercare: Secondary | ICD-10-CM | POA: Diagnosis not present

## 2017-10-29 DIAGNOSIS — D508 Other iron deficiency anemias: Secondary | ICD-10-CM | POA: Diagnosis not present

## 2017-11-26 DIAGNOSIS — I10 Essential (primary) hypertension: Secondary | ICD-10-CM | POA: Diagnosis not present

## 2017-11-26 DIAGNOSIS — I2510 Atherosclerotic heart disease of native coronary artery without angina pectoris: Secondary | ICD-10-CM | POA: Diagnosis not present

## 2017-11-26 DIAGNOSIS — I4892 Unspecified atrial flutter: Secondary | ICD-10-CM | POA: Diagnosis not present

## 2017-11-26 DIAGNOSIS — I472 Ventricular tachycardia: Secondary | ICD-10-CM | POA: Diagnosis not present

## 2017-12-10 DIAGNOSIS — Z23 Encounter for immunization: Secondary | ICD-10-CM | POA: Diagnosis not present

## 2018-01-16 DIAGNOSIS — L821 Other seborrheic keratosis: Secondary | ICD-10-CM | POA: Diagnosis not present

## 2018-01-16 DIAGNOSIS — L570 Actinic keratosis: Secondary | ICD-10-CM | POA: Diagnosis not present

## 2018-01-16 DIAGNOSIS — D225 Melanocytic nevi of trunk: Secondary | ICD-10-CM | POA: Diagnosis not present

## 2018-01-29 DIAGNOSIS — Z9081 Acquired absence of spleen: Secondary | ICD-10-CM | POA: Diagnosis not present

## 2018-01-29 DIAGNOSIS — D508 Other iron deficiency anemias: Secondary | ICD-10-CM | POA: Diagnosis not present

## 2018-01-29 DIAGNOSIS — R911 Solitary pulmonary nodule: Secondary | ICD-10-CM | POA: Diagnosis not present

## 2018-01-29 DIAGNOSIS — R42 Dizziness and giddiness: Secondary | ICD-10-CM | POA: Diagnosis not present

## 2018-01-29 DIAGNOSIS — R7989 Other specified abnormal findings of blood chemistry: Secondary | ICD-10-CM | POA: Diagnosis not present

## 2018-01-29 DIAGNOSIS — I10 Essential (primary) hypertension: Secondary | ICD-10-CM | POA: Diagnosis not present

## 2018-01-29 DIAGNOSIS — I480 Paroxysmal atrial fibrillation: Secondary | ICD-10-CM | POA: Diagnosis not present

## 2018-02-17 DIAGNOSIS — R918 Other nonspecific abnormal finding of lung field: Secondary | ICD-10-CM | POA: Diagnosis not present

## 2018-04-07 DIAGNOSIS — R918 Other nonspecific abnormal finding of lung field: Secondary | ICD-10-CM | POA: Diagnosis not present

## 2018-04-07 DIAGNOSIS — I2510 Atherosclerotic heart disease of native coronary artery without angina pectoris: Secondary | ICD-10-CM | POA: Diagnosis not present

## 2018-04-07 DIAGNOSIS — M7989 Other specified soft tissue disorders: Secondary | ICD-10-CM | POA: Diagnosis not present

## 2018-04-07 DIAGNOSIS — I10 Essential (primary) hypertension: Secondary | ICD-10-CM | POA: Diagnosis not present

## 2018-04-07 DIAGNOSIS — R42 Dizziness and giddiness: Secondary | ICD-10-CM | POA: Diagnosis not present

## 2018-04-07 DIAGNOSIS — Z23 Encounter for immunization: Secondary | ICD-10-CM | POA: Diagnosis not present

## 2018-04-07 DIAGNOSIS — Z0000 Encounter for general adult medical examination without abnormal findings: Secondary | ICD-10-CM | POA: Diagnosis not present

## 2018-04-07 DIAGNOSIS — E875 Hyperkalemia: Secondary | ICD-10-CM | POA: Diagnosis not present

## 2018-04-08 DIAGNOSIS — I472 Ventricular tachycardia: Secondary | ICD-10-CM | POA: Diagnosis not present

## 2018-04-08 DIAGNOSIS — I4892 Unspecified atrial flutter: Secondary | ICD-10-CM | POA: Diagnosis not present

## 2018-04-08 DIAGNOSIS — I10 Essential (primary) hypertension: Secondary | ICD-10-CM | POA: Diagnosis not present

## 2018-04-14 DIAGNOSIS — M7989 Other specified soft tissue disorders: Secondary | ICD-10-CM | POA: Diagnosis not present

## 2018-04-14 DIAGNOSIS — R222 Localized swelling, mass and lump, trunk: Secondary | ICD-10-CM | POA: Diagnosis not present

## 2018-04-15 DIAGNOSIS — Z7184 Encounter for health counseling related to travel: Secondary | ICD-10-CM | POA: Diagnosis not present

## 2018-04-15 DIAGNOSIS — Z23 Encounter for immunization: Secondary | ICD-10-CM | POA: Diagnosis not present

## 2018-05-06 DIAGNOSIS — R918 Other nonspecific abnormal finding of lung field: Secondary | ICD-10-CM | POA: Diagnosis not present

## 2019-07-12 ENCOUNTER — Ambulatory Visit: Payer: Medicare Other

## 2019-07-28 ENCOUNTER — Ambulatory Visit: Payer: Medicare Other
